# Patient Record
Sex: Female | Born: 1995 | Race: Black or African American | Hispanic: No | Marital: Single | Smoking: Never smoker
Health system: Southern US, Community
[De-identification: ages and names within clinical notes are randomized; demographics above are authoritative.]

---

## 2015-03-23 ENCOUNTER — Encounter (HOSPITAL_COMMUNITY): Payer: Self-pay | Admitting: *Deleted

## 2015-03-23 ENCOUNTER — Emergency Department (HOSPITAL_COMMUNITY)
Admission: EM | Admit: 2015-03-23 | Discharge: 2015-03-23 | Disposition: A | Discharge status: Home / Self Care | Payer: Medicaid Other | Attending: Emergency Medicine | Admitting: Emergency Medicine

## 2015-03-23 ENCOUNTER — Emergency Department (HOSPITAL_COMMUNITY): Payer: Medicaid Other

## 2015-03-23 DIAGNOSIS — Y9389 Activity, other specified: Secondary | ICD-10-CM | POA: Insufficient documentation

## 2015-03-23 DIAGNOSIS — Y92480 Sidewalk as the place of occurrence of the external cause: Secondary | ICD-10-CM | POA: Insufficient documentation

## 2015-03-23 DIAGNOSIS — F10129 Alcohol abuse with intoxication, unspecified: Secondary | ICD-10-CM | POA: Diagnosis present

## 2015-03-23 DIAGNOSIS — X58XXXA Exposure to other specified factors, initial encounter: Secondary | ICD-10-CM | POA: Insufficient documentation

## 2015-03-23 DIAGNOSIS — S0081XA Abrasion of other part of head, initial encounter: Secondary | ICD-10-CM | POA: Insufficient documentation

## 2015-03-23 DIAGNOSIS — Y998 Other external cause status: Secondary | ICD-10-CM | POA: Insufficient documentation

## 2015-03-23 DIAGNOSIS — F10929 Alcohol use, unspecified with intoxication, unspecified: Secondary | ICD-10-CM

## 2015-03-23 LAB — CBC WITH DIFFERENTIAL/PLATELET
Basophils Absolute: 0 10*3/uL (ref 0.0–0.1)
Basophils Relative: 0 % (ref 0–1)
EOS ABS: 0.2 10*3/uL (ref 0.0–0.7)
EOS PCT: 3 % (ref 0–5)
HCT: 40.5 % (ref 36.0–46.0)
Hemoglobin: 14 g/dL (ref 12.0–15.0)
Lymphocytes Relative: 18 % (ref 12–46)
Lymphs Abs: 1.1 10*3/uL (ref 0.7–4.0)
MCH: 32.3 pg (ref 26.0–34.0)
MCHC: 34.6 g/dL (ref 30.0–36.0)
MCV: 93.3 fL (ref 78.0–100.0)
Monocytes Absolute: 0.4 10*3/uL (ref 0.1–1.0)
Monocytes Relative: 7 % (ref 3–12)
NEUTROS PCT: 72 % (ref 43–77)
Neutro Abs: 4.3 10*3/uL (ref 1.7–7.7)
Platelets: 189 10*3/uL (ref 150–400)
RBC: 4.34 MIL/uL (ref 3.87–5.11)
RDW: 12.1 % (ref 11.5–15.5)
WBC: 6 10*3/uL (ref 4.0–10.5)

## 2015-03-23 LAB — COMPREHENSIVE METABOLIC PANEL
ALT: 10 U/L — ABNORMAL LOW (ref 14–54)
AST: 16 U/L (ref 15–41)
Albumin: 4 g/dL (ref 3.5–5.0)
Alkaline Phosphatase: 53 U/L (ref 38–126)
Anion gap: 8 (ref 5–15)
BILIRUBIN TOTAL: 0.5 mg/dL (ref 0.3–1.2)
BUN: 9 mg/dL (ref 6–20)
CHLORIDE: 109 mmol/L (ref 101–111)
CO2: 20 mmol/L — AB (ref 22–32)
CREATININE: 0.67 mg/dL (ref 0.44–1.00)
Calcium: 8.3 mg/dL — ABNORMAL LOW (ref 8.9–10.3)
GFR calc Af Amer: >60 mL/min (ref >60)
GFR calc non Af Amer: >60 mL/min (ref >60)
GLUCOSE: 108 mg/dL — AB (ref 70–99)
Potassium: 3.1 mmol/L — ABNORMAL LOW (ref 3.5–5.1)
Sodium: 137 mmol/L (ref 135–145)
Total Protein: 7.1 g/dL (ref 6.5–8.1)

## 2015-03-23 LAB — ETHANOL: ALCOHOL ETHYL (B): 255 mg/dL — AB (ref <5)

## 2015-03-23 MED ORDER — SODIUM CHLORIDE 0.9 % IV BOLUS (SEPSIS)
1000.0000 mL | Freq: Once | INTRAVENOUS | Status: AC
  Administered 2015-03-23: 1000 mL via INTRAVENOUS

## 2015-03-23 MED ORDER — ONDANSETRON HCL 4 MG/2ML IJ SOLN
4.0000 mg | Freq: Once | INTRAMUSCULAR | Status: AC
  Administered 2015-03-23: 4 mg via INTRAVENOUS
  Filled 2015-03-23: qty 2

## 2015-03-23 NOTE — ED Notes (Signed)
Bed: RESB Expected date:  Expected time:  Means of arrival:  Comments: EMS/overdose alcohol
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
## 2015-03-23 NOTE — ED Notes (Signed)
Pt ambulated w/ no assistance, no distress, states she is ready to go home.
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
## 2015-03-23 NOTE — Discharge Instructions (Signed)
Drunk  You drank to the point of becoming unconscious.  This is very dangerous, as you could easily die from choking on your own vomit or alcohol toxicity.  You will need to be very careful in the future to not drink to excess.  Expect to have a headache today, and to have nausea and vomiting.  This is good, to continue to purge the alcohol from your system.  Stick to a bland diet, drink plenty of water, but go slow.   ALCOHOL INTOXICATION  ALCOHOL INTOXICATION: You have been seen for intoxication with alcohol.  The use of alcohol in a manner such that you ended up here today strongly suggests that you may have a problem with alcohol abuse.  The abuse of alcohol can cause many chronic problems including liver disease, stomach ulcers and pancreatitis.  If alcohol is consumed in very large amounts, it can cause you to stop breathing and die.  Fortunately, you did not suffer any life-threatening complications with this episode.  DO NOT DRIVE A VEHICLE UNDER THE INFLUENCE OF ALCOHOL! YOU MAY INJURE OR KILL YOURSELF OR SOMEONE ELSE IF YOU DRINK AND DRIVE.  YOU SHOULD SEEK MEDICAL ATTENTION IMMEDIATELY, EITHER HERE OR AT THE NEAREST EMERGENCY DEPARTMENT, IF ANY OF THE FOLLOWING OCCURS:    You have any other episodes of alcohol intoxication in which you drink enough to have a loss of consciousness ("black outs").   You develop any confusion, lethargy or altered thinking, even while not being intoxicated.   Behavioral Health Resources [facility]   [phone]   [facility]   [phone]  [facility]    Mobile Crisis                         [phone]   [facility]            [place]                            [phone]  [place]                          [phone]  24 hours                              [phone]   [facility]                [phone] 24hr                                     [phone]   [place]                              [phone]   [place]                             [phone]   [facility] 24 hr Access line                   [phone] [facility]      [phone]   [facility] 24hr Murphy Oilccess line  [phone]   [facility]                                 [phone]   [phone]                           [phone]    *SUBSTANCE ABUSE REFERRAL       SUBSTANCE ABUSE REFERRALS       IN-PATIENT DETOX  Name Address Phone Number  [facility] [address] [place], [place] [phone]  [facility] [address], [place] [phone]  [facility] [address], [place] [phone]  [facility] [place], [place] [phone]  [facility] [place] [address] [place], [place] [phone]        LONG-TERM TREATMENT  [facility] [address] [facility] [phone]  [facility] [address] [place] [phone]  [employer] [address] [place], [place] [phone]  [facility] [address] [place] [phone]  [facility] [place], [place] [phone]  [facility] [address] [place], [place] [phone]  [facility] [address] [place], [place] [phone]  [facility] [address] [place], [place] [phone]  [facility]  [address], [place], [place] [phone]  [facility] [place] [address] [place], [place] [phone]  [facility] [place] [address]. [place], [place] [phone]      OUT-PATIENT ALCOHOL AND DRUG TREATMENT  [facility]  [address] [place], [place] [phone]  [facility] [address] [place], [place] [phone]  [facility]   [address] [place], [place] [phone]  [facility] [address] [place], [place]  [phone]   [place] [phone] [place] [phone]   [place] [phone] [place]ita [place] [phone]  [facility]  [address] [place] [phone]  [facility]  [address] [place] [phone]  [address], [place] [phone]  [facility] [place], [place] [phone]  [facility] (Ages 13-25) [phone] [place] [address] [place] [phone]  [facility] (adolescent) [address]  [phone]  Alcoholics Anonymous [place] LimitShare.fiwww.aagreensboronc.com [phone] or       [phone]  Narcotics Anonymous www.na.Gerre Scullorg [phone]  [facility] [address] [place], [place] [phone]     *RESOURCE GUIDE  RESOURCE GUIDE  Dental Problems  Patients with Medicaid: [facility]                                            [facility] [id number]                                                                   [phone] Phone:  [phone]  Phone:  [phone]  If unable to pay or uninsured, contact:  [facility] or [facility] to become qualified for the adult dental clinic.  Chronic Pain Problems Contact [facility]  [phone] Patients need to be referred by their primary care doctor.  Insufficient Money for Medicine Contact United Way:  call "211" or [facility] [phone].  No Primary Care Doctor Call [facility]  [phone] Other agencies that provide inexpensive medical care    [facility]  [phone]    [facility]  [phone]    [facility]  [phone]    [facility]  [phone]    [facility]  [phone]    [facility]  [phone]  [facility] Services Special[facility]  [phone] [facility]   [phone] [facility]   [phone] (emergency services [phone])  Abuse/Neglect [facility] Child Abuse Hotline [phone])[phone](336) 9391122667 [facility] Child Abuse Hotline ([phone] (After Hours)  Emergency Shelter [facility] [phone](336) [phone]  Maternity Homes Room at the [place] [facility] [place] [phone](336) [phone] Rebeca AlertFl[facility] Services [phone]  MRSA Hotline #:   (909) 548-2048(562) 393-8666  [facility] Resources  [facility]     United Way                          [facility] [address] [place]                        [address]          [address] [place] [address]  [place]                                                [place]                            [place] Phone:  [phone]                                     Phone:  [phone]                   Phone:  [phone]  [facility] Phone:  [phone]  [facility] Child Abuse Hotline (321)111-8317(336) 802-077-5297 [phone](336) 3524613506 (After Hours)      If you develop symptoms of Shortness of Breath, Chest Pain, Swelling of lips, mouth or tongue or if your condition becomes worse with any new symptoms, see your doctor or return to the Emergency Department for immediate care. Emergency services are not intended to be a substitute for comprehensive medical attention.  Please contact your doctor for follow up if not improving as expected.   Call your doctor in 5-7 days or as directed if there is no improvement.   Community Resources: *IF YOU ARE IN IMMEDIATE DANGER CALL 911!  Abuse/Neglect:  Family Services Crisis Hotline [facility]): 704-031-5626(336) 5407784157 Center Against Violence [facility]([place]): 867-656-3110(336) [phone]  After hours, holidays and weekends: [phone](336) ([phone] National Domestic Violence Hotline: 337-867-4753(908)626-1619  Mental Health: [facility]: [address]: [phone](336) [phone]  Health Clinics:  Urgent  Care Center [facility]): [phone] Monday - Friday 8 AM - 9 PM, Saturday and [phone]  Our doctors and staff appreciate your choosing [place] for your emergency medical care needs. We are here to serve you.  Alcohol Intoxication Alcohol intoxication occurs when the amount of alcohol that a person has consumed impairs his or her ability to mentally and physically function. Alcohol directly impairs the normal chemical activity of the brain. Drinking large amounts of alcohol can lead to changes in mental function and behavior, and it can cause many physical effects that can be harmful.  Alcohol intoxication can range in severity from mild to very severe. Various factors can affect the level of intoxication that occurs, such as the person's age, gender, weight, frequency of  alcohol consumption, and the presence of other medical conditions (such as diabetes, seizures, or heart conditions). Dangerous levels of alcohol intoxication may occur when people drink large amounts of alcohol in a short period (binge drinking). Alcohol can also be especially dangerous when combined with certain prescription medicines or "recreational" drugs. SIGNS AND SYMPTOMS Some common signs and symptoms of mild alcohol intoxication include:  Loss of coordination.  Changes in mood and behavior.  Impaired judgment.  Slurred speech. As alcohol intoxication progresses to more severe levels, other signs and symptoms will appear. These may include:  Vomiting.  Confusion and impaired memory.  Slowed breathing.  Seizures.  Loss of consciousness. DIAGNOSIS  Your health care provider will take a medical history and perform a physical exam. You will be asked about the amount and type of alcohol you have consumed. Blood tests will be done to measure the concentration of alcohol in your blood. In many places, your blood alcohol level must be lower than 80 mg/dL (0.98%) to legally drive. However, many dangerous effects of alcohol can occur at much lower levels.  TREATMENT  People with alcohol intoxication often do not require treatment. Most of the effects of alcohol intoxication are temporary, and they go away as the alcohol naturally leaves the body. Your health care provider will monitor your condition until you are stable enough to go home. Fluids are sometimes given through an IV access tube to  help prevent dehydration.  HOME CARE INSTRUCTIONS  Do not drive after drinking alcohol.  Stay hydrated. Drink enough water and fluids to keep your urine clear or pale yellow. Avoid caffeine.   Only take over-the-counter or prescription medicines as directed by your health care provider.  SEEK MEDICAL CARE IF:   You have persistent vomiting.   You do not feel better after a few days.  You  have frequent alcohol intoxication. Your health care provider can help determine if you should see a substance use treatment counselor. SEEK IMMEDIATE MEDICAL CARE IF:   You become shaky or tremble when you try to stop drinking.   You shake uncontrollably (seizure).   You throw up (vomit) blood. This may be bright red or may look like black coffee grounds.   You have blood in your stool. This may be bright red or may appear as a black, tarry, bad smelling stool.   You become lightheaded or faint.  MAKE SURE YOU:   Understand these instructions.  Will watch your condition.  Will get help right away if you are not doing well or get worse. Document Released: 08/13/2005 Document Revised: 07/06/2013 Document Reviewed: 04/08/2013 [facility] Patient Information 2015 [place], [place]. This information is not intended to replace advice given to you by your health care provider. Make sure you discuss any questions you have with your health care provider.  How Much is Too Much Alcohol? Drinking too much alcohol can cause injury, accidents, and health problems. These types of problems can include:   Car crashes.  Falls.  Family fighting (domestic violence).  Drowning.  Fights.  Injuries.  Burns.  Damage to certain organs.  Having a baby with birth defects. ONE DRINK CAN BE TOO MUCH WHEN YOU ARE:  Working.  Pregnant or breastfeeding.  Taking medicines. Ask your doctor.  Driving or planning to drive. WHAT IS A STANDARD DRINK?   1 regular beer (12 ounces or 360 milliliters).  1 glass of wine (5 ounces or 150 milliliters).  1 shot of liquor (1.5 ounces or 45 milliliters). BLOOD ALCOHOL LEVELS   .00 A person is sober.  Marland Kitchen.03 A person has no trouble keeping balance, talking, or seeing right, but a "buzz" may be felt.  Marland Kitchen.05 A person feels "buzzed" and relaxed.  Marland Kitchen.08 or .10  A person is drunk. He or she has trouble talking, seeing right, and keeping his or her  balance.  .15 A person loses body control and may pass out (blackout).  .20 A person has trouble walking (staggering) and throws up (vomits).  .30 A person will pass out (unconscious).  .40+ A person will be in a coma. Death is possible. If you or someone you know has a drinking problem, get help from a doctor.  Document Released: 08/30/2009 Document Revised: 01/26/2012 Document Reviewed: 08/30/2009 [facility] Patient Information 2015 [place], [place]. This information is not intended to replace advice given to you by your health care provider. Make sure you discuss any questions you have with your health care provider.

## 2015-03-23 NOTE — ED Notes (Signed)
Awake, trying to get out of bed, states she needs to go to the bathroom.  Ambulated with assist to bathroom, void qs, back to bed, warm blankets applied.  VSS, no further emesis

## 2017-03-12 ENCOUNTER — Emergency Department (HOSPITAL_COMMUNITY)
Admission: EM | Admit: 2017-03-12 | Discharge: 2017-03-12 | Disposition: A | Discharge status: Home / Self Care | Payer: No Typology Code available for payment source | Attending: Emergency Medicine | Admitting: Emergency Medicine

## 2017-03-12 ENCOUNTER — Emergency Department (HOSPITAL_COMMUNITY): Payer: No Typology Code available for payment source

## 2017-03-12 ENCOUNTER — Encounter (HOSPITAL_COMMUNITY): Payer: Self-pay | Admitting: Emergency Medicine

## 2017-03-12 DIAGNOSIS — S4992XA Unspecified injury of left shoulder and upper arm, initial encounter: Secondary | ICD-10-CM | POA: Diagnosis present

## 2017-03-12 DIAGNOSIS — M25512 Pain in left shoulder: Secondary | ICD-10-CM | POA: Diagnosis not present

## 2017-03-12 DIAGNOSIS — Y92410 Unspecified street and highway as the place of occurrence of the external cause: Secondary | ICD-10-CM | POA: Diagnosis not present

## 2017-03-12 DIAGNOSIS — Y939 Activity, unspecified: Secondary | ICD-10-CM | POA: Diagnosis not present

## 2017-03-12 DIAGNOSIS — M898X1 Other specified disorders of bone, shoulder: Secondary | ICD-10-CM

## 2017-03-12 DIAGNOSIS — Y999 Unspecified external cause status: Secondary | ICD-10-CM | POA: Diagnosis not present

## 2017-03-12 MED ORDER — ACETAMINOPHEN 500 MG PO TABS
1000.0000 mg | ORAL_TABLET | Freq: Once | ORAL | Status: DC
  Filled 2017-03-12: qty 2
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
## 2018-02-25 ENCOUNTER — Other Ambulatory Visit: Payer: Self-pay | Admitting: Family

## 2018-02-25 DIAGNOSIS — N6320 Unspecified lump in the left breast, unspecified quadrant: Secondary | ICD-10-CM

## 2018-02-25 DIAGNOSIS — N6310 Unspecified lump in the right breast, unspecified quadrant: Secondary | ICD-10-CM

## 2018-03-12 ENCOUNTER — Other Ambulatory Visit: Payer: Self-pay

## 2018-03-12 ENCOUNTER — Inpatient Hospital Stay
Admission: RE | Admit: 2018-03-12 | Discharge: 2018-03-12 | Disposition: A | Discharge status: Home / Self Care | Payer: Self-pay | Source: Ambulatory Visit | Attending: Family | Admitting: Family
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
## 2019-01-10 ENCOUNTER — Other Ambulatory Visit: Payer: Self-pay

## 2019-01-10 ENCOUNTER — Emergency Department (HOSPITAL_COMMUNITY)
Admission: EM | Admit: 2019-01-10 | Discharge: 2019-01-11 | Disposition: A | Discharge status: Home / Self Care | Payer: BLUE CROSS/BLUE SHIELD | Attending: Emergency Medicine | Admitting: Emergency Medicine

## 2019-01-10 ENCOUNTER — Emergency Department (HOSPITAL_COMMUNITY): Payer: BLUE CROSS/BLUE SHIELD

## 2019-01-10 ENCOUNTER — Encounter (HOSPITAL_COMMUNITY): Payer: Self-pay

## 2019-01-10 DIAGNOSIS — J45901 Unspecified asthma with (acute) exacerbation: Secondary | ICD-10-CM | POA: Insufficient documentation

## 2019-01-10 DIAGNOSIS — R000 Tachycardia, unspecified: Secondary | ICD-10-CM | POA: Diagnosis not present

## 2019-01-10 DIAGNOSIS — R0602 Shortness of breath: Secondary | ICD-10-CM | POA: Diagnosis present

## 2019-01-10 LAB — CBC WITH DIFFERENTIAL/PLATELET
Abs Immature Granulocytes: 0.01 10*3/uL (ref 0.00–0.07)
BASOS ABS: 0 10*3/uL (ref 0.0–0.1)
Basophils Relative: 0 %
EOS PCT: 2 %
Eosinophils Absolute: 0.2 10*3/uL (ref 0.0–0.5)
HEMATOCRIT: 40.7 % (ref 36.0–46.0)
Hemoglobin: 13.4 g/dL (ref 12.0–15.0)
IMMATURE GRANULOCYTES: 0 %
LYMPHS ABS: 1.3 10*3/uL (ref 0.7–4.0)
LYMPHS PCT: 17 %
MCH: 32.1 pg (ref 26.0–34.0)
MCHC: 32.9 g/dL (ref 30.0–36.0)
MCV: 97.6 fL (ref 80.0–100.0)
Monocytes Absolute: 0.6 10*3/uL (ref 0.1–1.0)
Monocytes Relative: 8 %
NEUTROS PCT: 73 %
Neutro Abs: 5.5 10*3/uL (ref 1.7–7.7)
Platelets: 200 10*3/uL (ref 150–400)
RBC: 4.17 MIL/uL (ref 3.87–5.11)
RDW: 12.5 % (ref 11.5–15.5)
WBC: 7.6 10*3/uL (ref 4.0–10.5)
nRBC: 0 % (ref 0.0–0.2)

## 2019-01-10 LAB — COMPREHENSIVE METABOLIC PANEL
ALBUMIN: 3.5 g/dL (ref 3.5–5.0)
ALT: 13 U/L (ref 0–44)
AST: 18 U/L (ref 15–41)
Alkaline Phosphatase: 40 U/L (ref 38–126)
Anion gap: 9 (ref 5–15)
BILIRUBIN TOTAL: 0.3 mg/dL (ref 0.3–1.2)
BUN: 7 mg/dL (ref 6–20)
CO2: 23 mmol/L (ref 22–32)
CREATININE: 0.88 mg/dL (ref 0.44–1.00)
Calcium: 8.8 mg/dL — ABNORMAL LOW (ref 8.9–10.3)
Chloride: 108 mmol/L (ref 98–111)
GFR calc Af Amer: >60 mL/min (ref >60)
GFR calc non Af Amer: >60 mL/min (ref >60)
GLUCOSE: 123 mg/dL — AB (ref 70–99)
Potassium: 3.2 mmol/L — ABNORMAL LOW (ref 3.5–5.1)
Sodium: 140 mmol/L (ref 135–145)
Total Protein: 6.1 g/dL — ABNORMAL LOW (ref 6.5–8.1)

## 2019-01-10 LAB — I-STAT BETA HCG BLOOD, ED (MC, WL, AP ONLY): I-stat hCG, quantitative: <5 m[IU]/mL (ref <5)

## 2019-01-10 LAB — D-DIMER, QUANTITATIVE (NOT AT ARMC)

## 2019-01-10 MED ORDER — ALBUTEROL SULFATE (2.5 MG/3ML) 0.083% IN NEBU
5.0000 mg | INHALATION_SOLUTION | Freq: Once | RESPIRATORY_TRACT | Status: AC
  Administered 2019-01-10: 5 mg via RESPIRATORY_TRACT
  Filled 2019-01-10: qty 6

## 2019-01-10 MED ORDER — IPRATROPIUM BROMIDE 0.02 % IN SOLN
1.0000 mg | Freq: Once | RESPIRATORY_TRACT | Status: AC
  Administered 2019-01-10: 1 mg via RESPIRATORY_TRACT
  Filled 2019-01-10: qty 5

## 2019-01-10 MED ORDER — ALBUTEROL (5 MG/ML) CONTINUOUS INHALATION SOLN
15.0000 mg/h | INHALATION_SOLUTION | Freq: Once | RESPIRATORY_TRACT | Status: AC
  Administered 2019-01-10: 15 mg/h via RESPIRATORY_TRACT
  Filled 2019-01-10: qty 20

## 2019-01-10 MED ORDER — METHYLPREDNISOLONE SODIUM SUCC 125 MG IJ SOLR
125.0000 mg | Freq: Once | INTRAMUSCULAR | Status: AC
  Administered 2019-01-10: 125 mg via INTRAVENOUS
  Filled 2019-01-10: qty 2

## 2019-01-10 MED ORDER — PREDNISONE 20 MG PO TABS: ORAL_TABLET | ORAL | 0 refills | Status: AC

## 2019-01-10 MED ORDER — SODIUM CHLORIDE 0.9 % IV BOLUS
1000.0000 mL | Freq: Once | INTRAVENOUS | Status: AC
  Administered 2019-01-10: 1000 mL via INTRAVENOUS

## 2019-01-10 MED ORDER — MAGNESIUM SULFATE 2 GM/50ML IV SOLN
2.0000 g | Freq: Once | INTRAVENOUS | Status: AC
  Administered 2019-01-10: 2 g via INTRAVENOUS
  Filled 2019-01-10: qty 50

## 2019-01-10 NOTE — ED Notes (Signed)
Pt states albuterol treatment did not help.  Wheezing improved but not resolved at this time.

## 2019-01-10 NOTE — ED Triage Notes (Signed)
Pt here for asthma exacerbation.  Hx of same.  Used albuterol inhaler 2 times PTA.  Symptoms started at 6pm.  Wheezes heard bilaterally.

## 2019-01-10 NOTE — Discharge Instructions (Signed)
Take prednisone as prescribed.   Use your albuterol every 4 hrs as needed for cough or wheezing.   Your heart rate is elevated. Recheck with your doctor in a week.   See your doctor next week   Return to ER if you have worse shortness of breath, wheezing, fever, palpitations, chest pain

## 2019-01-10 NOTE — ED Notes (Signed)
Xray brought pt to room 9 thinking pt had a room assigned. Pt was never assigned a room... Pt now upset wanting to know why she has to go back to the lobby. Pt states she cant breathe... Mild wheezing noted but no inc work of breathing. Pt resting in wheelchair comfortably. Requesting prednisone while she waits. Informed her she must be assessed by a provider first.

## 2019-01-10 NOTE — ED Provider Notes (Signed)
[facility] EMERGENCY DEPARTMENT Provider Note   CSN: [id number] Arrival date & time: 01/10/19  1940    History   Chief Complaint Chief Complaint  Patient presents with  . Asthma    HPI Macee Taormina is a 23 y.o. female history of asthma here presenting with shortness of breath, wheezing.  Patient states that she had acute onset of some shortness of breath around 6 PM today.  She does have some sinus congestion for the last several days as well.  She states that this is similar to her previous asthma exacerbation.  She used albuterol several times today prior to arrival but still felt short of breath so came for evaluation. Patient denies any recent travel or history of blood clots.  Denies any hx of CAD.  Patient was seen about a month ago for asthma exacerbation and was sent home from the ED.      The history is provided by the patient.    History reviewed. No pertinent past medical history.  There are no active problems to display for this patient.   History reviewed. No pertinent surgical history.   OB History   No obstetric history on file.      Home Medications    Prior to Admission medications   Medication Sig Start Date End Date Taking? Authorizing Provider  albuterol (PROVENTIL HFA;VENTOLIN HFA) 108 (90 Base) MCG/ACT inhaler Inhale 2 puffs into the lungs every 6 (six) hours as needed for wheezing. 11/24/18  Yes [provider]  albuterol (PROVENTIL) (2.5 MG/3ML) 0.083% nebulizer solution Inhale 3 mLs into the lungs every 4 (four) hours as needed for wheezing. 10/15/18  Yes [provider]  diphenhydrAMINE (BENADRYL) 25 MG tablet Take 25 mg by mouth every 6 (six) hours as needed for allergies.   Yes [provider]    Family History History reviewed. No pertinent family history.  Social History Social History   Tobacco Use  . Smoking status: Never Smoker  . Smokeless tobacco: Never Used  Substance Use Topics  .  Alcohol use: Yes  . Drug use: No     Allergies   Nsaids; Other; Aspirin; and Shellfish allergy   Review of Systems Review of Systems  Respiratory: Positive for cough, shortness of breath and wheezing.   All other systems reviewed and are negative.    Physical Exam Updated Vital Signs BP 117/83 (BP Location: Right Arm)   Pulse (!) 121   Temp 98.1 F (36.7 C) (Oral)   Resp 18   LMP 01/03/2019   SpO2 100%   Physical Exam Vitals signs and nursing note reviewed.  Constitutional:      Appearance: Normal appearance.  HENT:     Head: Normocephalic.     Nose: Nose normal.     Mouth/Throat:     Mouth: Mucous membranes are moist.  Eyes:     Extraocular Movements: Extraocular movements intact.     Pupils: Pupils are equal, round, and reactive to light.  Neck:     Musculoskeletal: Normal range of motion.  Cardiovascular:     Rate and Rhythm: Regular rhythm. Tachycardia present.  Pulmonary:     Comments: Tachypneic, mild diffuse wheezing, no retractions  Abdominal:     General: Abdomen is flat.     Palpations: Abdomen is soft.  Musculoskeletal: Normal range of motion.        General: No swelling or tenderness.  Skin:    General: Skin is warm.     Capillary  Refill: Capillary refill takes less than 2 seconds.  Neurological:     General: No focal deficit present.     Mental Status: She is alert.  Psychiatric:        Mood and Affect: Mood normal.        Behavior: Behavior normal.      ED Treatments / Results  Labs (all labs ordered are listed, but only abnormal results are displayed) Labs Reviewed  COMPREHENSIVE METABOLIC PANEL - Abnormal; Notable for the following components:      Result Value   Potassium 3.2 (*)    Glucose, Bld 123 (*)    Calcium 8.8 (*)    Total Protein 6.1 (*)    All other components within normal limits  CBC WITH DIFFERENTIAL/PLATELET  D-DIMER, QUANTITATIVE (NOT AT  [facility])  I-STAT TROPONIN, ED  I-STAT BETA HCG BLOOD, ED ([facility], [facility], [facility] ONLY)      EKG EKG Interpretation  Date/Time:  Monday January 10 2019 21:19:17 EST Ventricular Rate:  116 PR Interval:    QRS Duration: 61 QT Interval:  368 QTC Calculation: 512 R Axis:   82 Text Interpretation:  Sinus tachycardia with irregular rate Borderline T abnormalities, anterior leads Prolonged QT interval No previous ECGs available Confirmed by Richardean Canal [phone]) on 01/10/2019 9:39:15 PM   Radiology Dg Chest 2 View  Result Date: 01/10/2019 CLINICAL DATA:  Shortness of breath EXAM: CHEST - 2 VIEW COMPARISON:  None. FINDINGS: Heart and mediastinal contours are within normal limits. No focal opacities or effusions. No acute bony abnormality. IMPRESSION: No active cardiopulmonary disease. Electronically Signed   By: Charlett Nose M.D.   On: 01/10/2019 20:52    Procedures Procedures (including critical care time)  Medications Ordered in ED Medications  magnesium sulfate IVPB 2 g 50 mL (2 g Intravenous New Bag/Given 01/10/19 2237)  albuterol (PROVENTIL) (2.5 MG/3ML) 0.083% nebulizer solution 5 mg (5 mg Nebulization Given 01/10/19 1959)  sodium chloride 0.9 % bolus 1,000 mL (1,000 mLs Intravenous New Bag/Given 01/10/19 2235)  methylPREDNISolone sodium succinate (SOLU-MEDROL) 125 mg/2 mL injection 125 mg (125 mg Intravenous Given 01/10/19 2239)  albuterol (PROVENTIL,VENTOLIN) solution continuous neb (15 mg/hr Nebulization Given 01/10/19 2130)  ipratropium (ATROVENT) nebulizer solution 1 mg (1 mg Nebulization Given 01/10/19 2129)     Initial Impression / Assessment and Plan / ED Course  I have reviewed the triage vital signs and the nursing notes.  Pertinent labs & imaging results that were available during my care of the patient were reviewed by me and considered in my medical decision making (see chart for details).       Breta Lobner is a 23 y.o. female here with SOB, wheezing. Likely asthma exacerbation. Given that she is tachycardic and seemed acute onset, will get labs, d-dimer,  CXR. Will give nebs, steroids and IVF.   11:15 PM Labs and CXR and d-dimer negative. No wheezing after nebs. Still mildly tachycardic. Will reassess.   11:41 PM Still tachycardic around 120. But no wheezing and felt better. I offered admission for observation but she request to go home. I think tachycardia likely from the continuous nebs that she received. Told her that if she has worse shortness of breath or palpitations to return to the ED immediately. Will dc home with course of steroids.    Final Clinical Impressions(s) / ED Diagnoses   Final diagnoses:  None    ED Discharge Orders    None       Charlynne Pander, MD 01/10/19  2342  
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
## 2019-01-11 LAB — I-STAT TROPONIN, ED: TROPONIN I, POC: 0 ng/mL (ref 0.00–0.08)

## 2020-10-09 IMAGING — DX DG CHEST 2V
2 series · 2 of 2 positions shown · non-contrast
Comparison: None.

CLINICAL DATA: Shortness of breath

EXAM:
CHEST - 2 VIEW

[chest lat]
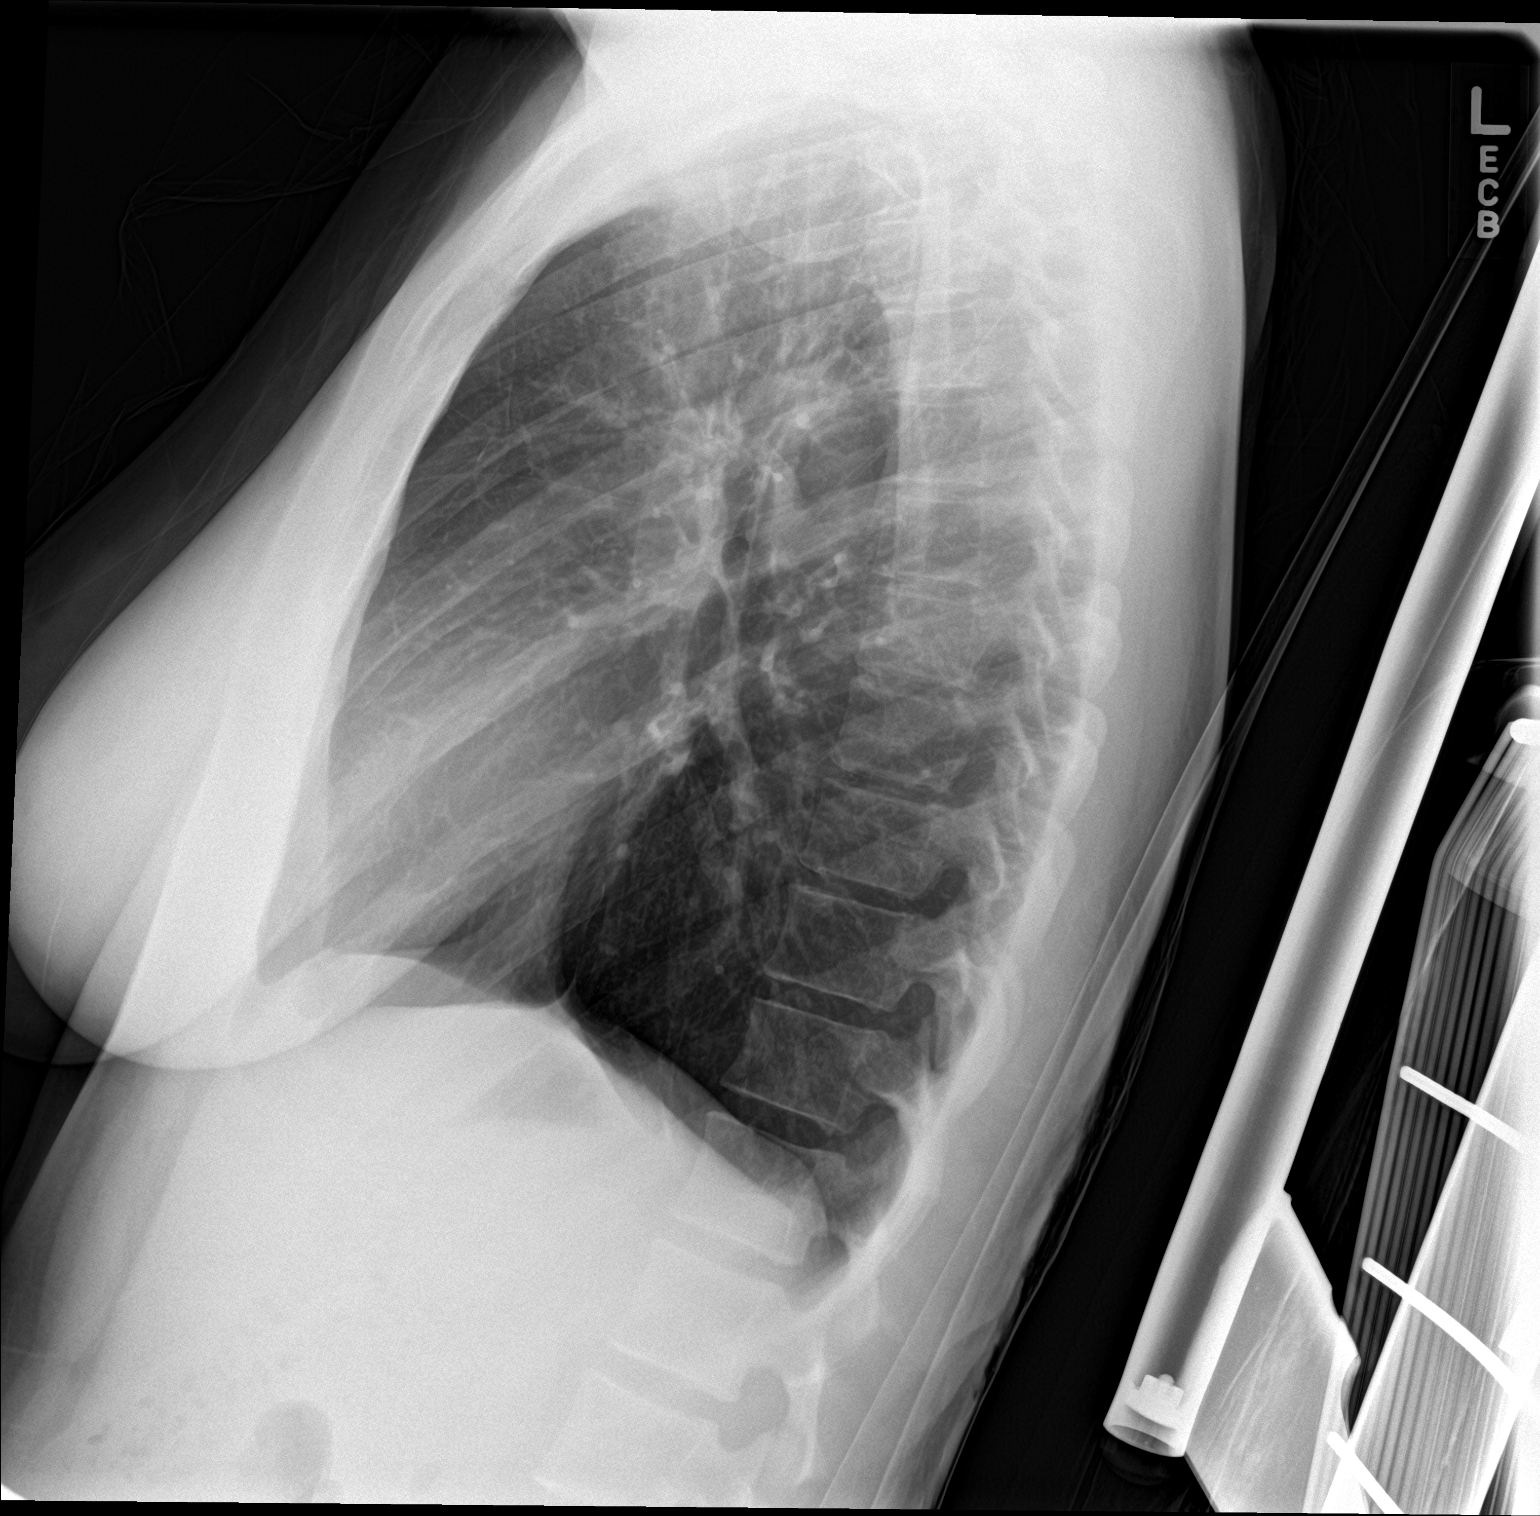

[chest ap]
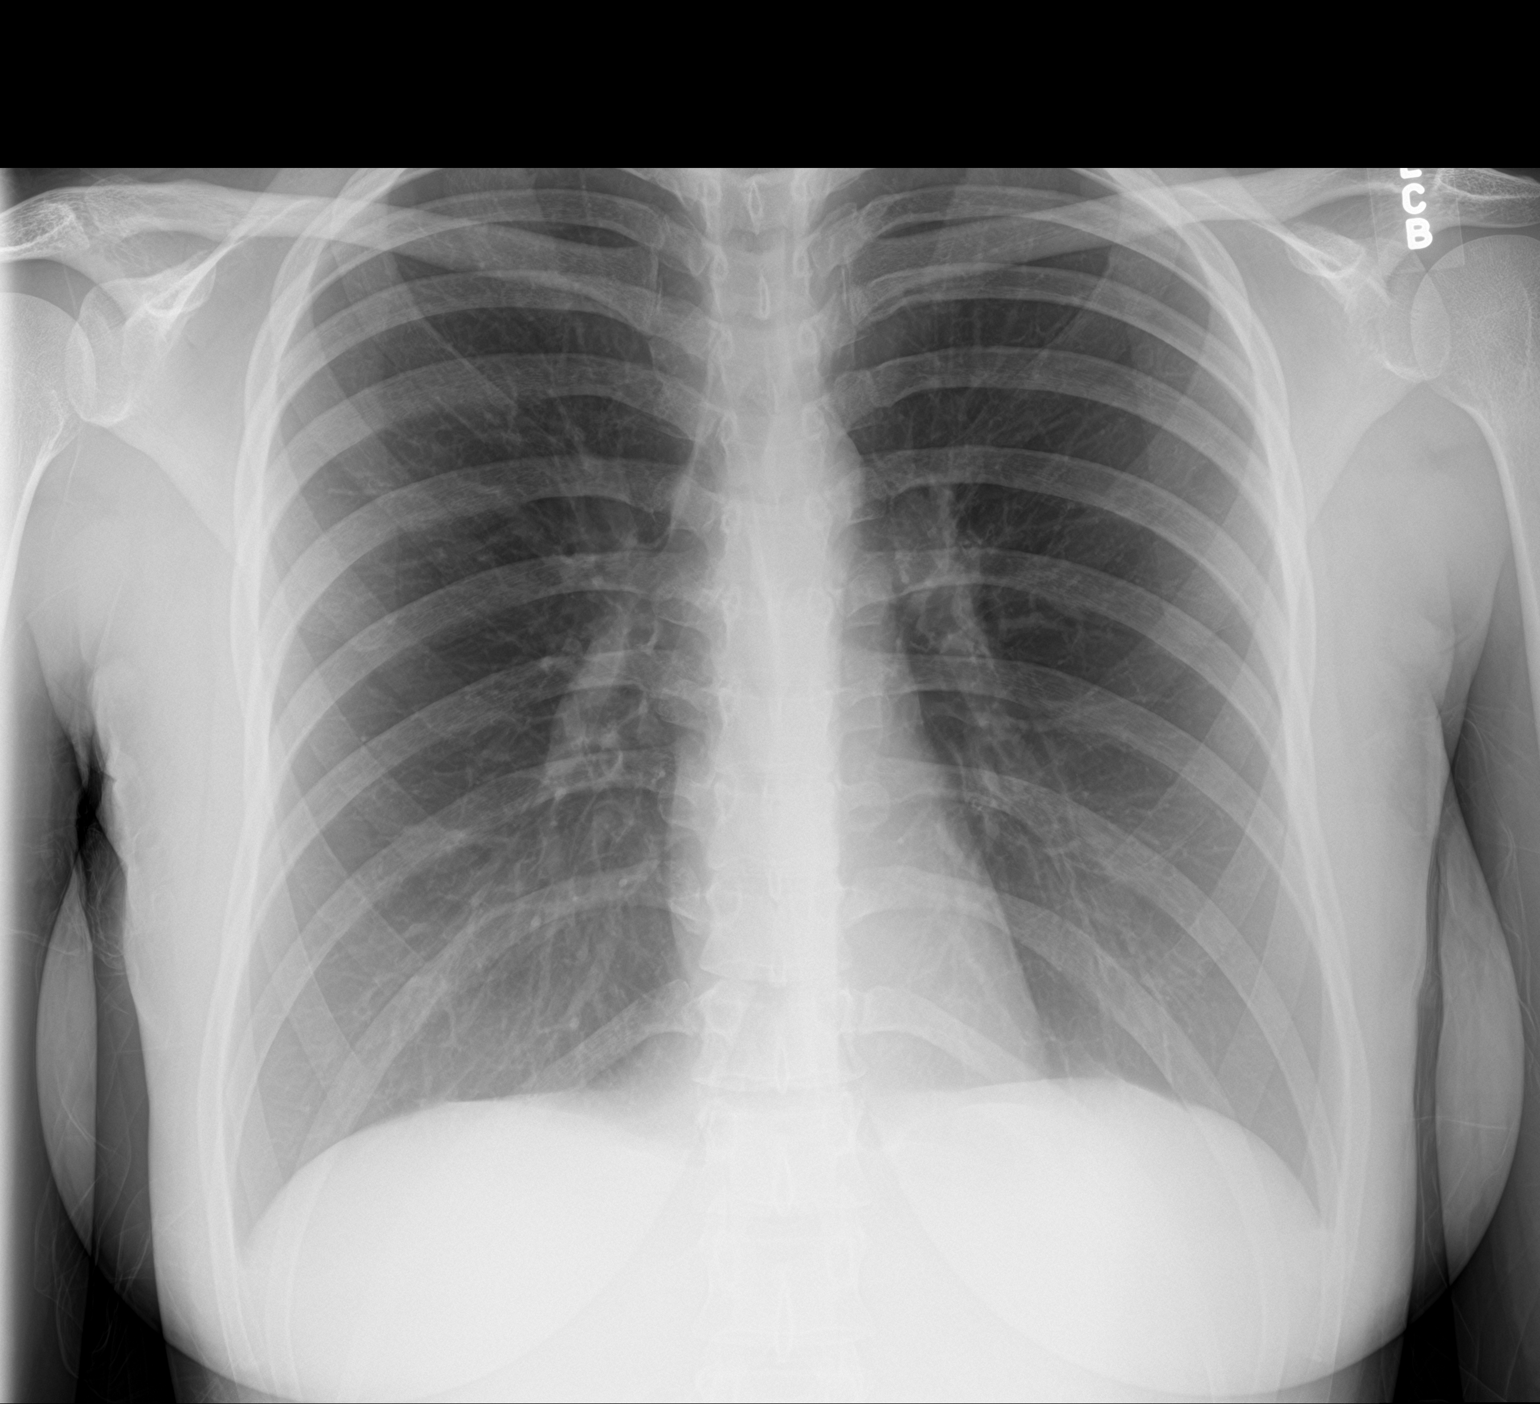

[2 of 2 positions shown; findings below may reference images not displayed]

FINDINGS: Heart and mediastinal contours are within normal limits. No focal
opacities or effusions. No acute bony abnormality.
IMPRESSION: No active cardiopulmonary disease.
